# Patient Record
Sex: Male | Born: 1990 | Race: Black or African American | Hispanic: No | Marital: Single | Smoking: Former smoker
Health system: Southern US, Community
[De-identification: ages and names within clinical notes are randomized; demographics above are authoritative.]

## PROBLEM LIST (undated history)

## (undated) HISTORY — PX: LEG SURGERY: SHX1003

---
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
## 2009-02-24 ENCOUNTER — Emergency Department (HOSPITAL_COMMUNITY): Admission: EM | Admit: 2009-02-24 | Discharge: 2009-02-24 | Payer: Self-pay | Admitting: Emergency Medicine

## 2009-08-29 ENCOUNTER — Emergency Department (HOSPITAL_COMMUNITY): Admission: EM | Admit: 2009-08-29 | Discharge: 2009-08-29 | Payer: Self-pay | Admitting: Family Medicine

## 2009-09-21 ENCOUNTER — Emergency Department (HOSPITAL_COMMUNITY): Admission: EM | Admit: 2009-09-21 | Discharge: 2009-09-21 | Payer: Self-pay | Admitting: Family Medicine

## 2010-04-04 ENCOUNTER — Emergency Department (HOSPITAL_COMMUNITY): Admission: EM | Admit: 2010-04-04 | Discharge: 2010-04-04 | Payer: Self-pay | Admitting: Family Medicine

## 2010-04-04 ENCOUNTER — Emergency Department (HOSPITAL_COMMUNITY): Admission: EM | Admit: 2010-04-04 | Discharge: 2010-04-04 | Payer: Self-pay | Admitting: Emergency Medicine

## 2010-06-04 ENCOUNTER — Encounter
Admission: RE | Admit: 2010-06-04 | Discharge: 2010-06-21 | Payer: Self-pay | Source: Home / Self Care | Attending: Neurosurgery | Admitting: Neurosurgery

## 2010-06-21 ENCOUNTER — Encounter
Admission: RE | Admit: 2010-06-21 | Discharge: 2010-07-24 | Payer: Self-pay | Source: Home / Self Care | Attending: Neurosurgery | Admitting: Neurosurgery

## 2010-07-10 ENCOUNTER — Encounter: Admit: 2010-07-10 | Payer: Self-pay | Admitting: Neurosurgery

## 2010-07-13 ENCOUNTER — Encounter: Admit: 2010-07-13 | Payer: Self-pay | Admitting: Neurosurgery

## 2010-07-26 ENCOUNTER — Ambulatory Visit: Payer: Worker's Compensation | Admitting: Physical Therapy

## 2010-07-26 ENCOUNTER — Ambulatory Visit: Payer: Worker's Compensation | Admitting: Occupational Therapy

## 2010-07-26 ENCOUNTER — Ambulatory Visit: Payer: Self-pay | Admitting: Physical Therapy

## 2010-07-31 ENCOUNTER — Encounter: Payer: Self-pay | Admitting: Occupational Therapy

## 2010-07-31 ENCOUNTER — Ambulatory Visit: Payer: Self-pay | Admitting: Physical Therapy

## 2010-07-31 ENCOUNTER — Ambulatory Visit: Payer: Worker's Compensation | Attending: Neurosurgery | Admitting: Physical Therapy

## 2010-07-31 DIAGNOSIS — R4189 Other symptoms and signs involving cognitive functions and awareness: Secondary | ICD-10-CM | POA: Insufficient documentation

## 2010-07-31 DIAGNOSIS — R5381 Other malaise: Secondary | ICD-10-CM | POA: Insufficient documentation

## 2010-07-31 DIAGNOSIS — R41842 Visuospatial deficit: Secondary | ICD-10-CM | POA: Insufficient documentation

## 2010-07-31 DIAGNOSIS — R279 Unspecified lack of coordination: Secondary | ICD-10-CM | POA: Insufficient documentation

## 2010-07-31 DIAGNOSIS — M6281 Muscle weakness (generalized): Secondary | ICD-10-CM | POA: Insufficient documentation

## 2010-07-31 DIAGNOSIS — Z5189 Encounter for other specified aftercare: Secondary | ICD-10-CM | POA: Insufficient documentation

## 2010-07-31 DIAGNOSIS — R269 Unspecified abnormalities of gait and mobility: Secondary | ICD-10-CM | POA: Insufficient documentation

## 2010-08-02 ENCOUNTER — Ambulatory Visit: Payer: Worker's Compensation | Admitting: Occupational Therapy

## 2010-08-02 ENCOUNTER — Ambulatory Visit: Payer: Worker's Compensation | Admitting: Physical Therapy

## 2010-08-07 ENCOUNTER — Encounter: Payer: Self-pay | Admitting: Occupational Therapy

## 2010-08-07 ENCOUNTER — Ambulatory Visit: Payer: Self-pay | Admitting: Physical Therapy

## 2010-08-07 ENCOUNTER — Ambulatory Visit: Payer: Worker's Compensation | Admitting: Occupational Therapy

## 2010-08-07 ENCOUNTER — Ambulatory Visit: Payer: Worker's Compensation | Admitting: Physical Therapy

## 2010-08-09 ENCOUNTER — Ambulatory Visit: Payer: Worker's Compensation | Admitting: Physical Therapy

## 2010-08-09 ENCOUNTER — Ambulatory Visit: Payer: Worker's Compensation | Admitting: Occupational Therapy

## 2010-08-14 ENCOUNTER — Encounter: Payer: Self-pay | Admitting: Occupational Therapy

## 2010-08-14 ENCOUNTER — Ambulatory Visit: Payer: Self-pay | Admitting: Physical Therapy

## 2010-08-16 ENCOUNTER — Ambulatory Visit: Payer: Self-pay | Admitting: Physical Therapy

## 2010-08-16 ENCOUNTER — Encounter: Payer: Self-pay | Admitting: Occupational Therapy

## 2010-08-16 ENCOUNTER — Ambulatory Visit: Payer: Worker's Compensation | Admitting: Physical Therapy

## 2010-08-21 ENCOUNTER — Ambulatory Visit: Payer: Worker's Compensation | Admitting: Occupational Therapy

## 2010-08-21 ENCOUNTER — Ambulatory Visit: Payer: Worker's Compensation | Admitting: Physical Therapy

## 2010-08-23 ENCOUNTER — Ambulatory Visit: Payer: Worker's Compensation | Attending: Neurosurgery | Admitting: Physical Therapy

## 2010-08-23 ENCOUNTER — Ambulatory Visit: Payer: Worker's Compensation | Admitting: Occupational Therapy

## 2010-08-23 DIAGNOSIS — R269 Unspecified abnormalities of gait and mobility: Secondary | ICD-10-CM | POA: Insufficient documentation

## 2010-08-23 DIAGNOSIS — Z5189 Encounter for other specified aftercare: Secondary | ICD-10-CM | POA: Insufficient documentation

## 2010-08-23 DIAGNOSIS — R4189 Other symptoms and signs involving cognitive functions and awareness: Secondary | ICD-10-CM | POA: Insufficient documentation

## 2010-08-23 DIAGNOSIS — M6281 Muscle weakness (generalized): Secondary | ICD-10-CM | POA: Insufficient documentation

## 2010-08-23 DIAGNOSIS — R5381 Other malaise: Secondary | ICD-10-CM | POA: Insufficient documentation

## 2010-08-23 DIAGNOSIS — R279 Unspecified lack of coordination: Secondary | ICD-10-CM | POA: Insufficient documentation

## 2010-08-23 DIAGNOSIS — R41842 Visuospatial deficit: Secondary | ICD-10-CM | POA: Insufficient documentation

## 2010-08-28 ENCOUNTER — Ambulatory Visit: Payer: Worker's Compensation | Admitting: Physical Therapy

## 2010-08-28 ENCOUNTER — Ambulatory Visit: Payer: Worker's Compensation | Admitting: Occupational Therapy

## 2010-08-29 ENCOUNTER — Encounter: Payer: Self-pay | Admitting: Occupational Therapy

## 2010-08-29 ENCOUNTER — Ambulatory Visit: Payer: Self-pay | Admitting: Physical Therapy

## 2010-09-04 ENCOUNTER — Ambulatory Visit: Payer: Worker's Compensation | Admitting: Physical Therapy

## 2010-09-04 ENCOUNTER — Ambulatory Visit: Payer: Worker's Compensation | Admitting: Occupational Therapy

## 2010-09-06 ENCOUNTER — Ambulatory Visit: Payer: Worker's Compensation | Admitting: Physical Therapy

## 2010-09-06 ENCOUNTER — Ambulatory Visit: Payer: Worker's Compensation | Admitting: Occupational Therapy

## 2010-09-11 ENCOUNTER — Ambulatory Visit: Payer: Worker's Compensation | Admitting: Physical Therapy

## 2010-09-11 ENCOUNTER — Ambulatory Visit: Payer: Worker's Compensation | Admitting: Occupational Therapy

## 2010-09-13 ENCOUNTER — Ambulatory Visit: Payer: Self-pay | Admitting: Physical Therapy

## 2010-09-13 ENCOUNTER — Encounter: Payer: Self-pay | Admitting: Occupational Therapy

## 2010-09-18 ENCOUNTER — Ambulatory Visit: Payer: Worker's Compensation | Admitting: Occupational Therapy

## 2010-09-18 ENCOUNTER — Ambulatory Visit: Payer: Worker's Compensation | Admitting: Physical Therapy

## 2010-09-20 ENCOUNTER — Ambulatory Visit: Payer: Worker's Compensation | Admitting: Occupational Therapy

## 2010-09-20 ENCOUNTER — Ambulatory Visit: Payer: Worker's Compensation | Admitting: Physical Therapy

## 2010-11-23 HISTORY — PX: APPENDECTOMY: SHX54

## 2010-12-02 ENCOUNTER — Other Ambulatory Visit (INDEPENDENT_AMBULATORY_CARE_PROVIDER_SITE_OTHER): Payer: Self-pay | Admitting: General Surgery

## 2010-12-02 ENCOUNTER — Emergency Department (HOSPITAL_COMMUNITY): Payer: Self-pay

## 2010-12-02 ENCOUNTER — Ambulatory Visit (HOSPITAL_COMMUNITY)
Admission: EM | Admit: 2010-12-02 | Discharge: 2010-12-03 | Disposition: A | Discharge status: Home / Self Care | Payer: Self-pay | Attending: Emergency Medicine | Admitting: Emergency Medicine

## 2010-12-02 DIAGNOSIS — E860 Dehydration: Secondary | ICD-10-CM | POA: Insufficient documentation

## 2010-12-02 DIAGNOSIS — K3580 Unspecified acute appendicitis: Secondary | ICD-10-CM | POA: Insufficient documentation

## 2010-12-02 DIAGNOSIS — Z01812 Encounter for preprocedural laboratory examination: Secondary | ICD-10-CM | POA: Insufficient documentation

## 2010-12-02 LAB — DIFFERENTIAL
Basophils Absolute: 0 10*3/uL (ref 0.0–0.1)
Basophils Relative: 0 % (ref 0–1)
Eosinophils Relative: 0 % (ref 0–5)
Lymphocytes Relative: 18 % (ref 12–46)

## 2010-12-02 LAB — CBC
HCT: 37.4 % — ABNORMAL LOW (ref 39.0–52.0)
RBC: 4.97 MIL/uL (ref 4.22–5.81)
RDW: 13.7 % (ref 11.5–15.5)
WBC: 7.9 10*3/uL (ref 4.0–10.5)

## 2010-12-02 LAB — COMPREHENSIVE METABOLIC PANEL
ALT: 11 U/L (ref 0–53)
AST: 18 U/L (ref 0–37)
Alkaline Phosphatase: 82 U/L (ref 39–117)
Calcium: 9.2 mg/dL (ref 8.4–10.5)
Chloride: 101 mEq/L (ref 96–112)
GFR calc Af Amer: >60 mL/min (ref >60)

## 2010-12-02 LAB — LIPASE, BLOOD: Lipase: 11 U/L (ref 11–59)

## 2010-12-06 NOTE — H&P (Signed)
NAMEGEMAYEL, MASCIO NO.:  [id number]  MEDICAL RECORD NO.:  [id number]  LOCATION:  5126                         FACILITY:  [facility]  PHYSICIAN:  Almond Lint, MD       DATE OF BIRTH:  January 02, 1991  DATE OF ADMISSION:  12/02/2010 DATE OF DISCHARGE:                             HISTORY & PHYSICAL   CHIEF COMPLAINT:  Right lower quadrant abdominal pain.  HISTORY OF PRESENT ILLNESS:  Ryan Blankenship is a 20 year old black male with no other significant past medical history who awoke from sleep this morning at 0230 a.m. with significant abdominal pain.  By 3 a.m., the patient states that he was having profuse nausea and vomiting.  He states that he has not had a bowel movement today, but had a normal one yesterday.  Due to worsening pain that eventually migrated towards his right lower quadrant, he presented to the emergency department.  Upon evaluation, the patient was found to have a normal white blood cell count at 7900, however, he did have a CT scan which revealed acute appendicitis.  We have been asked to evaluate the patient for surgical admission.  REVIEW OF SYSTEMS:  Please see HPI, otherwise all other systems have been reviewed and are negative.  The patient does complain of some difficulty urinating as he feels very dehydrated.  FAMILY HISTORY:  Noncontributory.  PAST MEDICAL HISTORY:  Clubfoot.  PAST SURGICAL HISTORY:  Orthopedic surgery for his clubfoot.  SOCIAL HISTORY:  The patient denies any tobacco, but admits to rare occasional alcohol use.  He denies any illicit drug abuse.  ALLERGIES:  NKDA.  MEDICATIONS:  None.  PHYSICAL EXAMINATION:  GENERAL:  Ryan Blankenship is a pleasant 20 year old black male who is well developed and well nourished and in mild distress secondary to pain. VITAL SIGNS:  Temperature 98.2, pulse 84, respirations 20, and blood pressure 148/94. HEENT:  Head is normocephalic and atraumatic.  Sclerae are noninjected. Pupils are  equal, round, and reactive to light.  Ears and nose without any obvious masses or lesions.  No rhinorrhea.  Mouth is pink and dry. Throat shows no exudate. HEART:  Regular rate and rhythm.  Normal S1 and S2.  No murmurs, gallops, or rubs noted.  He has palpable carotid, radial, and pedal pulses bilaterally. LUNGS:  Clear to auscultation bilaterally with no wheezes, rhonchi, or rales noted.  Respiratory is nonlabored. ABDOMEN:  Soft, tender in the right lower quadrant with decreased hypoactive bowel sounds.  He is otherwise nondistended.  No masses, hernias, or organomegaly are noted. SKIN:  The patient does have multiple tattoos on his chest, abdomen, and extremities. MUSCULOSKELETAL:  All 4 extremities are symmetrical.  No cyanosis, clubbing, or edema. PSYCH:  The patient is alert and oriented x3 with an appropriate affect.  LABORATORY DATA:  White blood cell count 7900, hemoglobin 12.5, hematocrit 37.4, and platelet count is 208,000.  Sodium 139, potassium 3.5, glucose 96, BUN 10, and creatinine 0.91.  DIAGNOSTICS:  CT scan of the abdomen and pelvis reveals acute appendicitis.  IMPRESSION: 1. Acute appendicitis. 2. Dehydration.  PLAN:  At this time, we will get the patient admitted.  We will plan for surgical  intervention in the operating room with an appendectomy.  I have discussed the procedure with the patient as well as risks and complications such as bleeding, infection, staple line complications such as leak and postoperative abscess as well as injury to surrounding organs.  The patient understands and wishes to proceed. We will start the patient on IV Invanz as well as p.r.n. medications for pain and nausea.     Letha Cape, PA   ______________________________ Almond Lint, MD    KEO/MEDQ  D:  12/02/2010  T:  12/03/2010  Job:  [id number]  Electronically Signed by Barnetta Chapel PA on 12/06/2010 10:35:06 AM Electronically Signed by Almond Lint MD on  12/06/2010 01:59:07 PM

## 2010-12-06 NOTE — Op Note (Signed)
NAMECORMICK, Ryan Blankenship NO.:  [id number]  MEDICAL RECORD NO.:  [id number]  LOCATION:  5126                         FACILITY:  [facility]  PHYSICIAN:  Almond Lint, MD       DATE OF BIRTH:  Jul 13, 1990  DATE OF PROCEDURE:  12/02/2010 DATE OF DISCHARGE:                              OPERATIVE REPORT   PREOPERATIVE DIAGNOSIS:  Acute appendicitis.  POSTOPERATIVE DIAGNOSIS:  Acute appendicitis.  PROCEDURE:  Laparoscopic appendectomy.  SURGEON:  Almond Lint, MD  ANESTHESIA:  General and local.  FINDINGS:  Dilated inflamed appendix.  SPECIMEN:  Appendix to pathology.  ESTIMATED BLOOD LOSS:  Minimal.  COMPLICATIONS:  None known.  PROCEDURE:  [name] was identified in the holding area and taken to operating room where he was placed on the operating room table.  General anesthesia was induced.  His abdomen was clipped, prepped, and draped in sterile fashion.  Time-out was performed according to surgical safety check list.  When all was correct, we continued.  The infraumbilical skin was anesthetized with local anesthetic.  A curvilinear transverse incision was made between his tattoos, using a #11 blade.  The subcutaneous tissues were divided bluntly with the [facility].  The midline fascia was elevated with 2 Kocher clamps and incised with #11 blade. The Tresa Endo was used to confirm entrance into the peritoneal cavity.  A 0 Vicryl pursestring suture was placed around the fascial incision and used to hold a Hasson trocar in place to the abdominal wall. Pneumoperitoneum was achieved to a pressure of 15 mmHg.  The patient was placed into Trendelenburg position and rotated to the left.  A 5-mm trocar was placed into the epigastrium and one in the left lower quadrant.  The appendix was then grasped and elevated.  There were significant peritoneal attachments to it.  These were taken down with the harmonic.  This was skeletonized down to the base and the appendiceal artery  was divided with the harmonic.  Once this had been performed, the base of the appendix was transected with the Endo-GIA stapler.  This was then retrieved from the abdomen and an EndoCatch bag through the umbilical incision.  Pneumoperitoneum was reachieved and the cecum was examined.  The staple line was intact.  There was no evidence of bleeding.  The right lower quadrant was irrigated and the pelvis was examined.  The 4 quadrant inspection was performed demonstrating no evidence of gross pathology in the remainder of the abdomen.  The elevator and trocars were removed under direct visualization without evidence of bleeding from the abdominal wall.  Pneumoperitoneum was allowed to evacuate through the Hasson trocar.  The fascial incision was closed with a pursestring suture and there was no residual or palpable fascial defect.  The skin of all the incisions was closed with a 4-0 Monocryl in subcuticular fashion.  The wounds were then cleaned, dried, and dressed with Dermabond.  The patient was awakened from anesthesia and taken to PACU in stable condition.  Needle, sponge, and instrument counts were correct.     Almond Lint, MD     FB/MEDQ  D:  12/02/2010  T:  12/03/2010  Job:  [id number]  Electronically  Signed by Almond Lint MD on 12/06/2010 02:00:10 PM

## 2011-01-03 ENCOUNTER — Encounter (INDEPENDENT_AMBULATORY_CARE_PROVIDER_SITE_OTHER): Payer: Self-pay | Admitting: General Surgery

## 2011-01-03 ENCOUNTER — Ambulatory Visit (INDEPENDENT_AMBULATORY_CARE_PROVIDER_SITE_OTHER): Payer: Medicaid Other | Admitting: General Surgery

## 2011-01-03 DIAGNOSIS — K3580 Unspecified acute appendicitis: Secondary | ICD-10-CM

## 2011-01-03 NOTE — Progress Notes (Signed)
Subjective:     Patient ID: Ryan Blankenship, male   DOB: 1990/07/02, 20 y.o.   MRN: [id number]    There were no vitals taken for this visit.    HPI The patient is doing well status post laparoscopic appendectomy. He is having no fevers or chills. He denies any continuation of narcotics. He is having bowel movements and eating okay. His energy level is returning to normal. He does to have some burning at the umbilical incision when he bends and does strenuos activity. He has not been taking any over-the-counter analgesics. He denies difficulty urinating.  Review of Systems Otherwise negative.    Objective:   Physical Exam General: Alert and oriented x3 no acute distress. Abdomen: Soft, nontender, nondistended. Incisions are healing well. There is some firmness and tenderness at the umbilical incision. There is no sign of infection in any of the incisions.   Assessment:     Patient is doing well status post laparoscopic appendectomy.    Plan:     I advised the patient to try ibuprofen or Aleve to help with the discomfort at the umbilical incision. I also advised him to try to hold off on extremely strenuous activity for another week or 2. He had tried to wash his dog and that elicited most of the discomfort. I advised him to follow up with me on an as-needed basis.

## 2011-04-01 LAB — POCT RAPID STREP A: Streptococcus, Group A Screen (Direct): NEGATIVE

## 2012-02-27 ENCOUNTER — Emergency Department (INDEPENDENT_AMBULATORY_CARE_PROVIDER_SITE_OTHER)
Admission: EM | Admit: 2012-02-27 | Discharge: 2012-02-27 | Disposition: A | Discharge status: Home / Self Care | Payer: Self-pay | Source: Home / Self Care | Attending: Emergency Medicine | Admitting: Emergency Medicine

## 2012-02-27 ENCOUNTER — Encounter (HOSPITAL_COMMUNITY): Payer: Self-pay | Admitting: Emergency Medicine

## 2012-02-27 DIAGNOSIS — N419 Inflammatory disease of prostate, unspecified: Secondary | ICD-10-CM

## 2012-02-27 LAB — POCT URINALYSIS DIP (DEVICE)
Bilirubin Urine: NEGATIVE
Hgb urine dipstick: NEGATIVE
Leukocytes, UA: NEGATIVE
Specific Gravity, Urine: >=1.03 (ref 1.005–1.030)
pH: 6.5 (ref 5.0–8.0)

## 2012-02-27 LAB — CBC WITH DIFFERENTIAL/PLATELET
Basophils Absolute: 0 10*3/uL (ref 0.0–0.1)
HCT: 40.1 % (ref 39.0–52.0)
Lymphocytes Relative: 42 % (ref 12–46)
Lymphs Abs: 2.7 10*3/uL (ref 0.7–4.0)
MCH: 25 pg — ABNORMAL LOW (ref 26.0–34.0)
MCV: 76.1 fL — ABNORMAL LOW (ref 78.0–100.0)
Monocytes Absolute: 0.7 10*3/uL (ref 0.1–1.0)
RBC: 5.27 MIL/uL (ref 4.22–5.81)
RDW: 13.8 % (ref 11.5–15.5)

## 2012-02-27 LAB — OCCULT BLOOD, POC DEVICE: Fecal Occult Bld: NEGATIVE

## 2012-02-27 MED ORDER — CIPROFLOXACIN HCL 500 MG PO TABS: 500.0000 mg | ORAL_TABLET | Freq: Two times a day (BID) | ORAL | Status: DC

## 2012-02-27 MED ORDER — CIPROFLOXACIN HCL 500 MG PO TABS: 500.0000 mg | ORAL_TABLET | Freq: Two times a day (BID) | ORAL | Status: AC

## 2012-02-27 MED ORDER — TRAMADOL HCL 50 MG PO TABS: 100.0000 mg | ORAL_TABLET | Freq: Three times a day (TID) | ORAL | Status: AC | PRN

## 2012-02-27 NOTE — ED Provider Notes (Signed)
Chief Complaint  Patient presents with  . Dysuria    History of Present Illness:    The patient is a 21 year old male who has had a 2-3 month history of lower abdominal pain located in the suprapubic area radiating through the back and in the testicles. Feels like a cramping pain. It's worse when his bladder is full and relieved by emptying his bladder. He feels chilled and has lost about 7 pounds. His appetite is poor. He notes testicular pain, lower abdominal pain, and lower back pain after he urinates or after he has a bowel movement. This can last for minutes to hours at a time. His testicles are actually tender but they're not swollen. He denies any testicular mass. Then this gets better after while. He denies any fever, chills, nausea, or vomiting. He's had no diarrhea or constipation. He has occasional bright red blood on the toilet paper after a bowel movement but not in the stool.  Review of Systems:  Other than noted above, the patient denies any of the following symptoms: Constitutional:  No fever, chills, fatigue. Lungs:  No cough or shortness of breath. Heart:  No chest pain, palpitations, syncope or edema.  No cardiac history. Abdomen:  No nausea, vomiting, hematememesis, melena, diarrhea, or hematochezia. GU:  No dysuria, frequency, urgency, or hematuria.    PMFSH:  Past medical history, family history, social history, meds, and allergies were reviewed along with nurse's notes.  No prior abdominal surgeries or history of GI problems.  No use of NSAIDs or aspirin.  No excessive  alcohol intake.  Physical Exam:   Vital signs:  BP 104/54  Pulse 82  Temp 98.8 F (37.1 C) (Oral)  Resp 14  SpO2 99% Gen:  Alert, oriented, in no distress. Lungs:  Breath sounds clear and equal bilaterally.  No wheezes, rales or rhonchi. Heart:  Regular rhythm.  No gallops or murmers.   Abdomen:  Abdomen is soft, flat, nondistended. He has moderate pain to palpation in the suprapubic area. There is no  guarding or rebound. He has mild pain to palpation in the right and left lower quadrants. No upper abdominal pain to palpation. No organomegaly or mass. Bowel sounds are normally active. Genital exam: There is tenderness to palpation in both inguinal areas and extreme tenderness to palpation in both testes. There is no mass, but it's very difficult to palpate adequately due to his extreme tenderness. There is no urethral discharge and no penile lesions. Rectal exam:  Digital rectal exam reveals normal sphincter tone and no masses. The prostate was very tender to palpation but has not enlarged or boggy. There no masses. Stool is heme negative. Skin:  Clear, warm and dry.  No rash.    Labs:   Results for orders placed during the hospital encounter of 02/27/12  POCT URINALYSIS DIP (DEVICE)      Component Value Range   Glucose, UA NEGATIVE  NEGATIVE mg/dL   Bilirubin Urine NEGATIVE  NEGATIVE   Ketones, ur TRACE (*) NEGATIVE mg/dL   Specific Gravity, Urine >=1.030  1.005 - 1.030   Hgb urine dipstick NEGATIVE  NEGATIVE   pH 6.5  5.0 - 8.0   Protein, ur 100 (*) NEGATIVE mg/dL   Urobilinogen, UA 0.2  0.0 - 1.0 mg/dL   Nitrite NEGATIVE  NEGATIVE   Leukocytes, UA NEGATIVE  NEGATIVE  CBC WITH DIFFERENTIAL      Component Value Range   WBC 6.5  4.0 - 10.5 K/uL   RBC 5.27  4.22 - 5.81 MIL/uL   Hemoglobin 13.2  13.0 - 17.0 g/dL   HCT 45.4  09.8 - 11.9 %   MCV 76.1 (*) 78.0 - 100.0 fL   MCH 25.0 (*) 26.0 - 34.0 pg   MCHC 32.9  30.0 - 36.0 g/dL   RDW 14.7  82.9 - 56.2 %   Platelets 243  150 - 400 K/uL   Neutrophils Relative 47  43 - 77 %   Neutro Abs 3.1  1.7 - 7.7 K/uL   Lymphocytes Relative 42  12 - 46 %   Lymphs Abs 2.7  0.7 - 4.0 K/uL   Monocytes Relative 11  3 - 12 %   Monocytes Absolute 0.7  0.1 - 1.0 K/uL   Eosinophils Relative 0  0 - 5 %   Eosinophils Absolute 0.0  0.0 - 0.7 K/uL   Basophils Relative 0  0 - 1 %   Basophils Absolute 0.0  0.0 - 0.1 K/uL  OCCULT BLOOD, POC DEVICE       Component Value Range   Fecal Occult Bld NEGATIVE      Other Labs Obtained at Urgent Care Center:  Urine was cultured also obtained HIV and GC and Chlamydia DNA probes.  Results are pending at this time and we will call about any positive results.  Assessment:  The encounter diagnosis was Prostatitis.  Differential diagnosis includes prostatitis, urinary tract infection, urethritis, epididymitis, Crohn's disease, or other intra-abdominal process.  Plan:   1.  The following meds were prescribed:   New Prescriptions   CIPROFLOXACIN (CIPRO) 500 MG TABLET    Take 1 tablet (500 mg total) by mouth every 12 (twelve) hours.   TRAMADOL (ULTRAM) 50 MG TABLET    Take 2 tablets (100 mg total) by mouth every 8 (eight) hours as needed for pain.   2.  The patient was instructed in symptomatic care and handouts were given. 3.  The patient was told to return if becoming worse in any way, if no better in 3 or 4 days, and given some red flag symptoms that would indicate earlier return.  Follow up:  The patient was told to follow up with Dr. Ezzie Dural in 2 weeks. He was told to return to the emergency department if he should get worse in any way, particularly with fever or vomiting.    Reuben Likes, MD 02/27/12 2139

## 2012-02-27 NOTE — ED Notes (Signed)
For 2 weeks pt having abdominal, lower back, testicular and penile pain. Pt states it feels like he has to pee/stool huge amounts all the time and it hurts even after peeing. States it hurts his testicles and back before and after he pees/stools making it difficult to walk. He does not report frequency as he only goes 2-4 times a day. Pt noted small amounts of blood when wiping after bm's a few times. Pt denies discharge, or any new sexual partners and states he always uses protection.

## 2012-02-28 LAB — URINE CULTURE: Colony Count: NO GROWTH

## 2012-02-28 LAB — HIV ANTIBODY (ROUTINE TESTING W REFLEX): HIV: NONREACTIVE

## 2012-02-28 LAB — GC/CHLAMYDIA PROBE AMP, GENITAL: GC Probe Amp, Genital: NEGATIVE

## 2012-03-06 ENCOUNTER — Telehealth (HOSPITAL_COMMUNITY): Payer: Self-pay | Admitting: *Deleted

## 2012-03-06 NOTE — ED Notes (Signed)
Pt. called on VM on 9/11 for his lab results. 9/13 Pt. verified x 2 and given results. ( GC/Chlamydia neg., HIV non-reactive, Urine culture: no growth). Pt. Instructed to get his HIV rechecked in 6 mos. Vassie Moselle 03/06/2012

## 2012-07-22 ENCOUNTER — Encounter (HOSPITAL_COMMUNITY): Payer: Self-pay | Admitting: Emergency Medicine

## 2012-07-22 ENCOUNTER — Emergency Department (HOSPITAL_COMMUNITY): Payer: Worker's Compensation

## 2012-07-22 ENCOUNTER — Emergency Department (HOSPITAL_COMMUNITY)
Admission: EM | Admit: 2012-07-22 | Discharge: 2012-07-22 | Disposition: A | Discharge status: Home / Self Care | Payer: Worker's Compensation | Attending: Emergency Medicine | Admitting: Emergency Medicine

## 2012-07-22 DIAGNOSIS — W230XXA Caught, crushed, jammed, or pinched between moving objects, initial encounter: Secondary | ICD-10-CM | POA: Insufficient documentation

## 2012-07-22 DIAGNOSIS — Y9289 Other specified places as the place of occurrence of the external cause: Secondary | ICD-10-CM | POA: Insufficient documentation

## 2012-07-22 DIAGNOSIS — Y990 Civilian activity done for income or pay: Secondary | ICD-10-CM | POA: Insufficient documentation

## 2012-07-22 DIAGNOSIS — Y939 Activity, unspecified: Secondary | ICD-10-CM | POA: Insufficient documentation

## 2012-07-22 DIAGNOSIS — S5010XA Contusion of unspecified forearm, initial encounter: Secondary | ICD-10-CM | POA: Insufficient documentation

## 2012-07-22 DIAGNOSIS — R209 Unspecified disturbances of skin sensation: Secondary | ICD-10-CM | POA: Insufficient documentation

## 2012-07-22 LAB — CBC WITH DIFFERENTIAL/PLATELET
Basophils Absolute: 0 10*3/uL (ref 0.0–0.1)
Basophils Relative: 0 % (ref 0–1)
HCT: 40.2 % (ref 39.0–52.0)
Lymphocytes Relative: 21 % (ref 12–46)
Monocytes Absolute: 0.6 10*3/uL (ref 0.1–1.0)
Neutro Abs: 4.8 10*3/uL (ref 1.7–7.7)
Platelets: 225 10*3/uL (ref 150–400)
RBC: 5.32 MIL/uL (ref 4.22–5.81)
RDW: 14.1 % (ref 11.5–15.5)
WBC: 6.8 10*3/uL (ref 4.0–10.5)

## 2012-07-22 LAB — BASIC METABOLIC PANEL
BUN: 14 mg/dL (ref 6–23)
CO2: 26 mEq/L (ref 19–32)
Chloride: 105 mEq/L (ref 96–112)
Glucose, Bld: 76 mg/dL (ref 70–99)
Potassium: 3.9 mEq/L (ref 3.5–5.1)

## 2012-07-22 MED ORDER — HYDROCODONE-ACETAMINOPHEN 5-325 MG PO TABS: 1.0000 | ORAL_TABLET | Freq: Four times a day (QID) | ORAL | Status: AC | PRN

## 2012-07-22 MED ORDER — NAPROXEN 500 MG PO TABS: 500.0000 mg | ORAL_TABLET | Freq: Two times a day (BID) | ORAL | Status: AC

## 2012-07-22 MED ORDER — MORPHINE SULFATE 4 MG/ML IJ SOLN
4.0000 mg | Freq: Once | INTRAMUSCULAR | Status: AC
  Administered 2012-07-22: 4 mg via INTRAVENOUS
  Filled 2012-07-22: qty 1

## 2012-07-22 NOTE — ED Provider Notes (Signed)
History    CSN: [id number] Arrival date & time 07/22/12  1628 First MD Initiated Contact with Patient 07/22/12 1804    Chief Complaint  Patient presents with  . Arm Injury    HPI Pt was at work today approx 3pm when his arm got caught in a laminator.  His arm was stuck at the level of his elbow. Patient was seen in urgent care and x-rays the reportedly were normal. Patient was told to come to emergency room for further evaluation.  Patient has having a lot of pain in his upper arm. The pain increases whenever he tries to flex or extend his forearm as well as flex and extend his hand. He feels like his fingers are tingling and numb.  History reviewed. No pertinent past medical history.  Past Surgical History  Procedure Date  . Appendectomy 11/2010  . Leg surgery     No family history on file.  History  Substance Use Topics  . Smoking status: Never Smoker   . Smokeless tobacco: Not on file  . Alcohol Use: No      Review of Systems  All other systems reviewed and are negative.    Allergies  Review of patient's allergies indicates no known allergies.  Home Medications   Current Outpatient Rx  Name  Route  Sig  Dispense  Refill  . IBUPROFEN 200 MG PO TABS   Oral   Take 600 mg by mouth every 6 (six) hours as needed. For pain           BP 116/63  Pulse 67  Temp 98.5 F (36.9 C) (Oral)  SpO2 100%  Physical Exam  Nursing note and vitals reviewed. Constitutional: He appears well-developed and well-nourished. No distress.  HENT:  Head: Normocephalic and atraumatic.  Right Ear: External ear normal.  Left Ear: External ear normal.  Eyes: Conjunctivae normal are normal. Right eye exhibits no discharge. Left eye exhibits no discharge. No scleral icterus.  Neck: Neck supple. No tracheal deviation present.  Cardiovascular: Normal rate.   Pulmonary/Chest: Effort normal. No stridor. No respiratory distress.  Musculoskeletal: He exhibits edema and tenderness.       Right  forearm: He exhibits tenderness, swelling and edema. He exhibits no bony tenderness, no deformity and no laceration.       Left forearm: Normal. He exhibits no deformity and no laceration.       Strong radial pulse, tenderness palpation right elbow and upper arm, sensation light touch is intact although subjectively the patient feels like his sensation is altered in his fingertips,,  the pain increases in his forearm whenever he tries to flex and extend his wrist and forearm  Neurological: He is alert. Cranial nerve deficit: no gross deficits.  Skin: Skin is warm and dry. No rash noted.  Psychiatric: He has a normal mood and affect.    ED Course  Procedures (including critical care time)  Labs Reviewed  CBC WITH DIFFERENTIAL - Abnormal; Notable for the following:    MCV 75.6 (*)     MCH 24.8 (*)     All other components within normal limits  CK - Abnormal; Notable for the following:    Total CK 474 (*)     All other components within normal limits  BASIC METABOLIC PANEL   Dg Elbow 2 Views Right  07/22/2012  *RADIOLOGY REPORT*  Clinical Data: Right elbow injury.  Pain.  RIGHT ELBOW - 2 VIEW  Comparison: None.  Findings: The right elbow is located.  The forearm views demonstrate no effusion.  Soft tissue swelling is present dorsal to the elbow.  IMPRESSION:  1.  Dorsal soft tissue swelling without an acute or focal osseous abnormality.   Original Report Authenticated By: Marin Roberts, M.D.    Dg Forearm Right  07/22/2012  *RADIOLOGY REPORT*  Clinical Data: Right arm pain.  Machine injury.  RIGHT FOREARM - 2 VIEW  Comparison: Elbow films of the same day.  Findings: Soft tissue swelling is present about the elbow.  There is no underlying osseous abnormality.  There is no joint effusion. The forearm and wrist are unremarkable.  IMPRESSION:  1.  Soft tissue swelling dorsal to the elbow without an underlying fracture or effusion. 2.  The remainder of the forearm and wrist are normal.    Original Report Authenticated By: Marin Roberts, M.D.      1. Forearm contusion       MDM  The patient's symptoms are concerning for the possibility of a compartment syndrome considering a crush injury he experienced.  I reviewed his records and there are no x-rays available from the urgent care evaluation.  Will get x-rays here in emergent apartment. I've spoken with Dr. Mina Marble, orthopedic specialist who will come and evaluate the patient in emergent apartment   8:07 PM Dr Mina Marble evaluated the patient in the emergency department.  Pt does not have a compartment syndrome.  He can be treated with rest, ice , elevation and pain medications.        Celene Kras, MD 07/22/12 2007

## 2012-07-22 NOTE — ED Notes (Signed)
Ortho paged. 

## 2012-07-22 NOTE — ED Notes (Signed)
Pt st's his arm was caught in a machine today at work. Injury occurred about 2 1/2 hrs ago.  Was sent to ED from  Urgent Care.  Pt c/o pain with numbness to right elbow down to fingers.  Swelling present.

## 2012-07-22 NOTE — Consult Note (Signed)
Reason for Consult:right forearm swelling Referring Physician: knapp  Ryan Blankenship is an 22 y.o. male.  HPI: s/p roller injury at work today  History reviewed. No pertinent past medical history.  Past Surgical History  Procedure Date  . Appendectomy 11/2010  . Leg surgery     No family history on file.  Social History:  reports that he has never smoked. He does not have any smokeless tobacco history on file. He reports that he does not drink alcohol or use illicit drugs.  Allergies: No Known Allergies  Medications: PRN:    Results for orders placed during the hospital encounter of 07/22/12 (from the past 48 hour(s))  CBC WITH DIFFERENTIAL     Status: Abnormal   Collection Time   07/22/12  6:30 PM      Component Value Range Comment   WBC 6.8  4.0 - 10.5 K/uL    RBC 5.32  4.22 - 5.81 MIL/uL    Hemoglobin 13.2  13.0 - 17.0 g/dL    HCT 16.1  09.6 - 04.5 %    MCV 75.6 (*) 78.0 - 100.0 fL    MCH 24.8 (*) 26.0 - 34.0 pg    MCHC 32.8  30.0 - 36.0 g/dL    RDW 40.9  81.1 - 91.4 %    Platelets 225  150 - 400 K/uL    Neutrophils Relative 71  43 - 77 %    Neutro Abs 4.8  1.7 - 7.7 K/uL    Lymphocytes Relative 21  12 - 46 %    Lymphs Abs 1.4  0.7 - 4.0 K/uL    Monocytes Relative 8  3 - 12 %    Monocytes Absolute 0.6  0.1 - 1.0 K/uL    Eosinophils Relative 0  0 - 5 %    Eosinophils Absolute 0.0  0.0 - 0.7 K/uL    Basophils Relative 0  0 - 1 %    Basophils Absolute 0.0  0.0 - 0.1 K/uL   BASIC METABOLIC PANEL     Status: Normal   Collection Time   07/22/12  6:30 PM      Component Value Range Comment   Sodium 141  135 - 145 mEq/L    Potassium 3.9  3.5 - 5.1 mEq/L    Chloride 105  96 - 112 mEq/L    CO2 26  19 - 32 mEq/L    Glucose, Bld 76  70 - 99 mg/dL    BUN 14  6 - 23 mg/dL    Creatinine, Ser 7.82  0.50 - 1.35 mg/dL    Calcium 9.7  8.4 - 95.6 mg/dL    GFR calc non Af Amer >90  >90 mL/min    GFR calc Af Amer >90  >90 mL/min   CK     Status: Abnormal   Collection Time   07/22/12  6:30 PM      Component Value Range Comment   Total CK 474 (*) 7 - 232 U/L     Dg Elbow 2 Views Right  07/22/2012  *RADIOLOGY REPORT*  Clinical Data: Right elbow injury.  Pain.  RIGHT ELBOW - 2 VIEW  Comparison: None.  Findings: The right elbow is located.  The forearm views demonstrate no effusion.  Soft tissue swelling is present dorsal to the elbow.  IMPRESSION:  1.  Dorsal soft tissue swelling without an acute or focal osseous abnormality.   Original Report Authenticated By: Marin Roberts, M.D.    Dg Forearm  Right  07/22/2012  *RADIOLOGY REPORT*  Clinical Data: Right arm pain.  Machine injury.  RIGHT FOREARM - 2 VIEW  Comparison: Elbow films of the same day.  Findings: Soft tissue swelling is present about the elbow.  There is no underlying osseous abnormality.  There is no joint effusion. The forearm and wrist are unremarkable.  IMPRESSION:  1.  Soft tissue swelling dorsal to the elbow without an underlying fracture or effusion. 2.  The remainder of the forearm and wrist are normal.   Original Report Authenticated By: Marin Roberts, M.D.     Review of Systems  All other systems reviewed and are negative.   Blood pressure 116/63, pulse 67, temperature 98.5 F (36.9 C), temperature source Oral, SpO2 100.00%. Physical Exam  Constitutional: He is oriented to person, place, and time. He appears well-developed and well-nourished.  HENT:  Head: Normocephalic and atraumatic.  Cardiovascular: Normal rate.   Respiratory: Effort normal.  Musculoskeletal:       Right forearm: He exhibits tenderness and swelling.       Arms: Neurological: He is alert and oriented to person, place, and time.  Skin: Skin is warm.  Psychiatric: He has a normal mood and affect. His behavior is normal. Judgment and thought content normal.    Assessment/Plan: No signs of c/s  Would ice elevate advil  followup as needed  Anayelli Lai A 07/22/2012, 7:45 PM

## 2012-07-22 NOTE — ED Notes (Signed)
Ortho returned page  

## 2012-07-22 NOTE — Progress Notes (Signed)
Orthopedic Tech Progress Note Patient Details:  Ryan Blankenship 1990/07/04 [id number]  Ortho Devices Type of Ortho Device: Shoulder immobilizer Ortho Device/Splint Location: (R) UE Ortho Device/Splint Interventions: Application   Jennye Moccasin 07/22/2012, 8:36 PM

## 2012-08-08 ENCOUNTER — Other Ambulatory Visit: Payer: Self-pay

## 2013-04-29 ENCOUNTER — Other Ambulatory Visit: Payer: Self-pay

## 2015-05-15 ENCOUNTER — Encounter (HOSPITAL_COMMUNITY): Payer: Self-pay

## 2015-05-15 ENCOUNTER — Emergency Department (HOSPITAL_COMMUNITY): Payer: Self-pay

## 2015-05-15 ENCOUNTER — Emergency Department (HOSPITAL_COMMUNITY)
Admission: EM | Admit: 2015-05-15 | Discharge: 2015-05-15 | Disposition: A | Discharge status: Home / Self Care | Payer: Self-pay | Attending: Emergency Medicine | Admitting: Emergency Medicine

## 2015-05-15 DIAGNOSIS — J069 Acute upper respiratory infection, unspecified: Secondary | ICD-10-CM | POA: Insufficient documentation

## 2015-05-15 DIAGNOSIS — H938X3 Other specified disorders of ear, bilateral: Secondary | ICD-10-CM | POA: Insufficient documentation

## 2015-05-15 DIAGNOSIS — R0602 Shortness of breath: Secondary | ICD-10-CM | POA: Insufficient documentation

## 2015-05-15 DIAGNOSIS — J029 Acute pharyngitis, unspecified: Secondary | ICD-10-CM

## 2015-05-15 LAB — RAPID STREP SCREEN (MED CTR MEBANE ONLY): STREPTOCOCCUS, GROUP A SCREEN (DIRECT): NEGATIVE

## 2015-05-15 MED ORDER — ACETAMINOPHEN 500 MG PO TABS
1000.0000 mg | ORAL_TABLET | Freq: Once | ORAL | Status: AC
  Administered 2015-05-15: 1000 mg via ORAL
  Filled 2015-05-15: qty 2

## 2015-05-15 MED ORDER — DEXAMETHASONE 4 MG PO TABS
10.0000 mg | ORAL_TABLET | Freq: Once | ORAL | Status: AC
  Administered 2015-05-15: 10 mg via ORAL
  Filled 2015-05-15: qty 3

## 2015-05-15 MED ORDER — IBUPROFEN 800 MG PO TABS
800.0000 mg | ORAL_TABLET | Freq: Once | ORAL | Status: AC
  Administered 2015-05-15: 800 mg via ORAL
  Filled 2015-05-15: qty 1

## 2015-05-15 NOTE — Discharge Instructions (Signed)
Upper Respiratory Infection, Adult Most upper respiratory infections (URIs) are a viral infection of the air passages leading to the lungs. A URI affects the nose, throat, and upper air passages. The most common type of URI is nasopharyngitis and is typically referred to as "the common cold." URIs run their course and usually go away on their own. Most of the time, a URI does not require medical attention, but sometimes a bacterial infection in the upper airways can follow a viral infection. This is called a secondary infection. Sinus and middle ear infections are common types of secondary upper respiratory infections. Bacterial pneumonia can also complicate a URI. A URI can worsen asthma and chronic obstructive pulmonary disease (COPD). Sometimes, these complications can require emergency medical care and may be life threatening.  CAUSES Almost all URIs are caused by viruses. A virus is a type of germ and can spread from one person to another.  RISKS FACTORS You may be at risk for a URI if:   You smoke.   You have chronic heart or lung disease.  You have a weakened defense (immune) system.   You are very young or very old.   You have nasal allergies or asthma.  You work in crowded or poorly ventilated areas.  You work in health care facilities or schools. SIGNS AND SYMPTOMS  Symptoms typically develop 2-3 days after you come in contact with a cold virus. Most viral URIs last 7-10 days. However, viral URIs from the influenza virus (flu virus) can last 14-18 days and are typically more severe. Symptoms may include:   Runny or stuffy (congested) nose.   Sneezing.   Cough.   Sore throat.   Headache.   Fatigue.   Fever.   Loss of appetite.   Pain in your forehead, behind your eyes, and over your cheekbones (sinus pain).  Muscle aches.  DIAGNOSIS  Your health care provider may diagnose a URI by:  Physical exam.  Tests to check that your symptoms are not due to  another condition such as:  Strep throat.  Sinusitis.  Pneumonia.  Asthma. TREATMENT  A URI goes away on its own with time. It cannot be cured with medicines, but medicines may be prescribed or recommended to relieve symptoms. Medicines may help:  Reduce your fever.  Reduce your cough.  Relieve nasal congestion. HOME CARE INSTRUCTIONS   Take medicines only as directed by your health care provider.   Gargle warm saltwater or take cough drops to comfort your throat as directed by your health care provider.  Use a warm mist humidifier or inhale steam from a shower to increase air moisture. This may make it easier to breathe.  Drink enough fluid to keep your urine clear or pale yellow.   Eat soups and other clear broths and maintain good nutrition.   Rest as needed.   Return to work when your temperature has returned to normal or as your health care provider advises. You may need to stay home longer to avoid infecting others. You can also use a face mask and careful hand washing to prevent spread of the virus.  Increase the usage of your inhaler if you have asthma.   Do not use any tobacco products, including cigarettes, chewing tobacco, or electronic cigarettes. If you need help quitting, ask your health care provider. PREVENTION  The best way to protect yourself from getting a cold is to practice good hygiene.   Avoid oral or hand contact with people with cold   symptoms.   Wash your hands often if contact occurs.  There is no clear evidence that vitamin C, vitamin E, echinacea, or exercise reduces the chance of developing a cold. However, it is always recommended to get plenty of rest, exercise, and practice good nutrition.  SEEK MEDICAL CARE IF:   You are getting worse rather than better.   Your symptoms are not controlled by medicine.   You have chills.  You have worsening shortness of breath.  You have brown or red mucus.  You have yellow or brown nasal  discharge.  You have pain in your face, especially when you bend forward.  You have a fever.  You have swollen neck glands.  You have pain while swallowing.  You have white areas in the back of your throat. SEEK IMMEDIATE MEDICAL CARE IF:   You have severe or persistent:  Headache.  Ear pain.  Sinus pain.  Chest pain.  You have chronic lung disease and any of the following:  Wheezing.  Prolonged cough.  Coughing up blood.  A change in your usual mucus.  You have a stiff neck.  You have changes in your:  Vision.  Hearing.  Thinking.  Mood. MAKE SURE YOU:   Understand these instructions.  Will watch your condition.  Will get help right away if you are not doing well or get worse.   This information is not intended to replace advice given to you by your health care provider. Make sure you discuss any questions you have with your health care provider.   Document Released: 12/04/2000 Document Revised: 10/25/2014 Document Reviewed: 09/15/2013 Elsevier Interactive Patient Education 2016 [employer]  

## 2015-05-15 NOTE — ED Provider Notes (Signed)
CSN: [id number]     Arrival date & time 05/15/15  0715 History   First MD Initiated Contact with Patient 05/15/15 [phone]     Chief Complaint  Patient presents with  . Sore Throat  . Shortness of Breath     (Consider location/radiation/quality/duration/timing/severity/associated sxs/prior Treatment) Patient is a 24 y.o. male presenting with pharyngitis and shortness of breath. The history is provided by the patient.  Sore Throat This is a new problem. The current episode started more than 1 week ago. The problem occurs constantly. The problem has not changed since onset.Associated symptoms include shortness of breath. Pertinent negatives include no chest pain, no abdominal pain and no headaches. Nothing aggravates the symptoms. Nothing relieves the symptoms. He has tried nothing for the symptoms. The treatment provided no relief.  Shortness of Breath Associated symptoms: cough   Associated symptoms: no abdominal pain, no chest pain, no fever, no headaches, no rash and no vomiting    24 yo M with a chief complaint of sore throat and cough. This been going on for about a week. Patient feels like he's having trouble breathing through his nose. Was seen by his PCP about a week ago and given antibiotics which have not improved his symptoms. Patient denies fevers or chills. Patient denies chest pain. Denies abdominal pain or vomiting. Patient has some ear fullness bilaterally. Denies sick contacts.  History reviewed. No pertinent past medical history. Past Surgical History  Procedure Laterality Date  . Appendectomy  11/2010  . Leg surgery     No family history on file. Social History  Substance Use Topics  . Smoking status: Never Smoker   . Smokeless tobacco: None  . Alcohol Use: Yes     Comment: occassionally    Review of Systems  Constitutional: Negative for fever and chills.  HENT: Positive for congestion. Negative for facial swelling.   Eyes: Negative for discharge and visual  disturbance.  Respiratory: Positive for cough and shortness of breath.   Cardiovascular: Negative for chest pain and palpitations.  Gastrointestinal: Negative for vomiting, abdominal pain and diarrhea.  Musculoskeletal: Negative for myalgias and arthralgias.  Skin: Negative for color change and rash.  Neurological: Negative for tremors, syncope and headaches.  Psychiatric/Behavioral: Negative for confusion and dysphoric mood.      Allergies  Review of patient's allergies indicates no known allergies.  Home Medications   Prior to Admission medications   Medication Sig Start Date End Date Taking? Authorizing Provider  azithromycin (ZITHROMAX) 250 MG tablet Take 250 mg by mouth daily.   Yes Historical Provider, MD  HYDROcodone-acetaminophen (NORCO) 5-325 MG per tablet Take 1-2 tablets by mouth every 6 (six) hours as needed for pain. Patient not taking: Reported on 05/15/2015 07/22/12   Linwood Dibbles, MD  ibuprofen (ADVIL,MOTRIN) 200 MG tablet Take 600 mg by mouth every 6 (six) hours as needed. For pain    Historical Provider, MD  naproxen (NAPROSYN) 500 MG tablet Take 1 tablet (500 mg total) by mouth 2 (two) times daily. Patient not taking: Reported on 05/15/2015 07/22/12   Linwood Dibbles, MD   BP 132/84 mmHg  Pulse 69  Temp(Src) 98.3 F (36.8 C) (Oral)  Resp 16  Ht  (1.753 m)  Wt 222 lb (100.699 kg)  BMI 32.77 kg/m2  SpO2 98% Physical Exam  Constitutional: He is oriented to person, place, and time. He appears well-developed and well-nourished.  HENT:  Head: Normocephalic and atraumatic.  Purulent drainage to the left tonsil. No noted tonsillar swelling.  Handling secretions. Left anterior cervical lymphadenopathy. Swollen turbinates.   Eyes: EOM are normal. Pupils are equal, round, and reactive to light.  Neck: Normal range of motion. Neck supple. No JVD present.  Cardiovascular: Normal rate and regular rhythm.  Exam reveals no gallop and no friction rub.   No murmur  heard. Pulmonary/Chest: No respiratory distress. He has no wheezes.  Abdominal: He exhibits no distension. There is no rebound and no guarding.  Musculoskeletal: Normal range of motion.  Neurological: He is alert and oriented to person, place, and time.  Skin: No rash noted. No pallor.  Psychiatric: He has a normal mood and affect. His behavior is normal.  Nursing note and vitals reviewed.   ED Course  Procedures (including critical care time) Labs Review Labs Reviewed  RAPID STREP SCREEN (NOT AT [facility])  CULTURE, GROUP A STREP    Imaging Review Dg Chest 2 View  05/15/2015  CLINICAL DATA:  24 year old male with shortness breath and sore throat for 1 week. EXAM: CHEST  2 VIEW COMPARISON:  No priors. FINDINGS: Lung volumes are normal. No consolidative airspace disease. No pleural effusions. No pneumothorax. No pulmonary nodule or mass noted. Pulmonary vasculature and the cardiomediastinal silhouette are within normal limits. IMPRESSION: No radiographic evidence of acute cardiopulmonary disease. Electronically Signed   By: Trudie Reedaniel  Entrikin M.D.   On: 05/15/2015 07:58   I have personally reviewed and evaluated these images and lab results as part of my medical decision-making.   EKG Interpretation   Date/Time:  Monday May 15 2015 07:27:07 EST Ventricular Rate:  73 PR Interval:  138 QRS Duration: 69 QT Interval:  364 QTC Calculation: 401 R Axis:   57 Text Interpretation:  Sinus rhythm Probable left atrial enlargement  Baseline wander in lead(s) V4 No old tracing to compare Confirmed by Corrin Hingle  MD, DANIEL ([id number]) on 05/15/2015 10:38:02 AM      MDM   Final diagnoses:  Sore throat  URI (upper respiratory infection)    24 yo M with a chief complaints of cough congestion sore throat. Rapid strep negative. Chest x-ray unremarkable. EKG unremarkable. Likely URI. Suggested Sudafed Tylenol and Motrin push fluids.  I have discussed the diagnosis/risks/treatment options with the  patient and family and believe the pt to be eligible for discharge home to follow-up with PCP. We also discussed returning to the ED immediately if new or worsening sx occur. We discussed the sx which are most concerning (e.g., sudden worsening pain, fever, inability to tolerate by mouth) that necessitate immediate return. Medications administered to the patient during their visit and any new prescriptions provided to the patient are listed below.  Medications given during this visit Medications  acetaminophen (TYLENOL) tablet 1,000 mg (1,000 mg Oral Given 05/15/15 0743)  ibuprofen (ADVIL,MOTRIN) tablet 800 mg (800 mg Oral Given 05/15/15 0743)  dexamethasone (DECADRON) tablet 10 mg (10 mg Oral Given 05/15/15 0915)    Discharge Medication List as of 05/15/2015  9:08 AM      The patient appears reasonably screen and/or stabilized for discharge and I doubt any other medical condition or other [facility] requiring further screening, evaluation, or treatment in the ED at this time prior to discharge.     Melene Planan Rhianne Soman, DO 05/15/15 [phone]

## 2015-05-15 NOTE — ED Notes (Signed)
Pt. Presents with complaint of SOB and sore throat x 1 week. States seen by PCP and given abx, abx course completed with no improvement. Denies hx of asthma.

## 2015-05-15 NOTE — ED Notes (Signed)
Patient transported to X-ray 

## 2015-05-17 LAB — CULTURE, GROUP A STREP

## 2016-05-10 DIAGNOSIS — Y999 Unspecified external cause status: Secondary | ICD-10-CM | POA: Insufficient documentation

## 2016-05-10 DIAGNOSIS — M545 Low back pain: Secondary | ICD-10-CM | POA: Insufficient documentation

## 2016-05-10 DIAGNOSIS — Y92481 Parking lot as the place of occurrence of the external cause: Secondary | ICD-10-CM | POA: Insufficient documentation

## 2016-05-10 DIAGNOSIS — S80212A Abrasion, left knee, initial encounter: Secondary | ICD-10-CM | POA: Insufficient documentation

## 2016-05-10 DIAGNOSIS — Y939 Activity, unspecified: Secondary | ICD-10-CM | POA: Insufficient documentation

## 2016-05-11 ENCOUNTER — Emergency Department (HOSPITAL_COMMUNITY): Payer: Self-pay

## 2016-05-11 ENCOUNTER — Emergency Department (HOSPITAL_COMMUNITY)
Admission: EM | Admit: 2016-05-11 | Discharge: 2016-05-11 | Disposition: A | Discharge status: Home / Self Care | Payer: Self-pay | Attending: Emergency Medicine | Admitting: Emergency Medicine

## 2016-05-11 ENCOUNTER — Encounter (HOSPITAL_COMMUNITY): Payer: Self-pay | Admitting: Emergency Medicine

## 2016-05-11 DIAGNOSIS — M545 Low back pain, unspecified: Secondary | ICD-10-CM

## 2016-05-11 DIAGNOSIS — S80212A Abrasion, left knee, initial encounter: Secondary | ICD-10-CM

## 2016-05-11 MED ORDER — IBUPROFEN 800 MG PO TABS: 800.0000 mg | ORAL_TABLET | Freq: Three times a day (TID) | ORAL | 0 refills | Status: AC

## 2016-05-11 MED ORDER — IBUPROFEN 800 MG PO TABS
800.0000 mg | ORAL_TABLET | Freq: Once | ORAL | Status: AC
  Administered 2016-05-11: 800 mg via ORAL
  Filled 2016-05-11: qty 1

## 2016-05-11 MED ORDER — LIDOCAINE-PRILOCAINE 2.5-2.5 % EX CREA
TOPICAL_CREAM | Freq: Once | CUTANEOUS | Status: AC
  Administered 2016-05-11: 03:00:00 via TOPICAL
  Filled 2016-05-11: qty 5

## 2016-05-11 MED ORDER — CYCLOBENZAPRINE HCL 10 MG PO TABS: 5.0000 mg | ORAL_TABLET | Freq: Two times a day (BID) | ORAL | 0 refills | Status: AC | PRN

## 2016-05-11 MED ORDER — CYCLOBENZAPRINE HCL 10 MG PO TABS
10.0000 mg | ORAL_TABLET | Freq: Once | ORAL | Status: AC
  Administered 2016-05-11: 10 mg via ORAL
  Filled 2016-05-11: qty 1
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
## 2016-05-11 NOTE — ED Notes (Signed)
Ortho at bedside at this time providing patient with crutches/teaching.

## 2016-05-11 NOTE — Progress Notes (Signed)
Orthopedic Tech Progress Note Patient Details:  Ryan Blankenship 03/28/1991 [id number]  Ortho Devices Type of Ortho Device: Crutches Ortho Device/Splint Interventions: Ordered, Application   Trinna PostMartinez, [name] 05/11/2016, 3:32 AM

## 2016-07-01 ENCOUNTER — Telehealth: Payer: Self-pay

## 2016-07-01 ENCOUNTER — Institutional Professional Consult (permissible substitution): Payer: Managed Care, Other (non HMO) | Admitting: Neurology

## 2016-07-01 NOTE — Telephone Encounter (Signed)
I spoke to Ryan Blankenship and r/s to tomorrow. Provider is out sick.

## 2016-07-02 ENCOUNTER — Telehealth: Payer: Self-pay

## 2016-07-02 ENCOUNTER — Institutional Professional Consult (permissible substitution): Payer: Self-pay | Admitting: Neurology

## 2016-07-02 NOTE — Telephone Encounter (Signed)
LM for patient to call back and r/s, provider is out sick.

## 2016-07-04 ENCOUNTER — Telehealth: Payer: Self-pay

## 2016-07-04 ENCOUNTER — Encounter: Payer: Self-pay | Admitting: Neurology

## 2016-07-04 ENCOUNTER — Ambulatory Visit (INDEPENDENT_AMBULATORY_CARE_PROVIDER_SITE_OTHER): Payer: Managed Care, Other (non HMO) | Admitting: Neurology

## 2016-07-04 ENCOUNTER — Institutional Professional Consult (permissible substitution): Payer: Self-pay | Admitting: Neurology

## 2016-07-04 VITALS — BP 147/87 | HR 88 | Resp 20 | Ht 69.0 in | Wt 228.0 lb

## 2016-07-04 DIAGNOSIS — G4733 Obstructive sleep apnea (adult) (pediatric): Secondary | ICD-10-CM | POA: Insufficient documentation

## 2016-07-04 DIAGNOSIS — G478 Other sleep disorders: Secondary | ICD-10-CM | POA: Diagnosis not present

## 2016-07-04 DIAGNOSIS — G4726 Circadian rhythm sleep disorder, shift work type: Secondary | ICD-10-CM | POA: Diagnosis not present

## 2016-07-04 DIAGNOSIS — G4719 Other hypersomnia: Secondary | ICD-10-CM

## 2016-07-04 DIAGNOSIS — G47411 Narcolepsy with cataplexy: Secondary | ICD-10-CM | POA: Diagnosis not present

## 2016-07-04 DIAGNOSIS — R0683 Snoring: Secondary | ICD-10-CM

## 2016-07-04 MED ORDER — MODAFINIL 200 MG PO TABS: 200.0000 mg | ORAL_TABLET | Freq: Every day | ORAL | 0 refills | Status: DC
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
## 2016-07-04 NOTE — Telephone Encounter (Signed)
I spoke to patient and cancelled appt today with Dr. Frances FurbishAthar, she is leaving sick. I have rescheduled with Dr. Vickey Hugerohmeier this afternoon.

## 2016-07-04 NOTE — Patient Instructions (Addendum)
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 We may have to try different medications that may help you stay awake during the day. Not everything works with everybody the same way. Wake promoting agents include stimulants and non-stimulant type medications. The most common side effects with stimulants are weight loss, insomnia, nervousness, headaches, palpitations, rise in blood pressure, anxiety. Stimulants can be addictive and subject to abuse. Non-stimulant type wake promoting medications include Provigil and Nuvigil, most common side effects include headaches, nervousness, insomnia, hypertension. In addition there is a medication called Xyrem which has been proven to be very effective in patients with narcolepsy with or without cataplexy. Some patients with narcolepsy report episodes of weakness, such as jaw or facial weakness, legs giving out, feeling wobbly or like "Jell-o", etc. in situations of anxiety, stress, laughter, sudden sadness, surprise, etc., which is called cataplexy. You can also experience episodes of sleep paralysis during which you may feel unable to move upon awakening. Some people experience dreamlike sequences upon awakening or upon drifting off to sleep, called hypnopompic or hypnagogic hallucinations.    Modafinil tablets What is this medicine? MODAFINIL (moe DAF i nil) is used to treat excessive sleepiness caused by certain sleep disorders. This includes narcolepsy, sleep apnea, and shift work sleep disorder. This medicine may be used for other purposes; ask your health care provider or pharmacist if you have questions. COMMON BRAND NAME(S): Provigil What should I tell my health care provider before I take this medicine? They need to know if you have any of these conditions: -history of depression, mania, or other  mental disorder -kidney disease -liver disease -an unusual or allergic reaction to modafinil, other medicines, foods, dyes, or preservatives -pregnant or trying to get pregnant -breast-feeding How should I use this medicine? Take this medicine by mouth with a glass of water. Follow the directions on the prescription label. Take your doses at regular intervals. Do not take your medicine more often than directed. Do not stop taking except on your doctor's advice. A special MedGuide will be given to you by the pharmacist with each prescription and refill. Be sure to read this information carefully each time. Talk to your pediatrician regarding the use of this medicine in children. This medicine is not approved for use in children. Overdosage: If you think you have taken too much of this medicine contact a poison control center or emergency room at once. NOTE: This medicine is only for you. Do not share this medicine with others. What if I miss a dose? If you miss a dose, take it as soon as you can. If it is almost time for your next dose, take only that dose. Do not take double or extra doses. What may interact with this medicine? Do not take this medicine with any of the following medications: -amphetamine or dextroamphetamine -dexmethylphenidate or methylphenidate -medicines called MAO Inhibitors like Nardil, Parnate, Marplan, Eldepryl -pemoline -procarbazine This medicine may also interact with the following medications: -antifungal medicines like itraconazole or ketoconazole -barbiturates like phenobarbital -birth control pills or other hormone-containing birth control devices or implants -carbamazepine -cyclosporine -diazepam -medicines for depression, anxiety, or psychotic disturbances -phenytoin -propranolol -triazolam -warfarin This list may not describe all possible interactions. Give your health care provider a list of all the medicines, herbs, non-prescription drugs, or  dietary supplements you use. Also tell them   if you smoke, drink alcohol, or use illegal drugs. Some items may interact with your medicine. What should I watch for while using this medicine? Visit your doctor or health care professional for regular checks on your progress. The full effects of this medicine may not be seen right away. This medicine may affect your concentration, function, or may hide signs that you are tired. You may get dizzy. Do not drive, use machinery, or do anything that needs mental alertness until you know how this drug affects you. Alcohol can make you more dizzy and may interfere with your response to this medicine or your alertness. Avoid alcoholic drinks. Birth control pills may not work properly while you are taking this medicine. Talk to your doctor about using an extra method of birth control. It is unknown if the effects of this medicine will be increased by the use of caffeine. Caffeine is available in many foods, beverages, and medications. Ask your doctor if you should limit or change your intake of caffeine-containing products while on this medicine. What side effects may I notice from receiving this medicine? Side effects that you should report to your doctor or health care professional as soon as possible: -allergic reactions like skin rash, itching or hives, swelling of the face, lips, or tongue -anxiety -breathing problems -chest pain -fast, irregular heartbeat -hallucinations -increased blood pressure -redness, blistering, peeling or loosening of the skin, including inside the mouth -sore throat, fever, or chills -suicidal thoughts or other mood changes -tremors -vomiting Side effects that usually do not require medical attention (report to your doctor or health care professional if they continue or are bothersome): -headache -nausea, diarrhea, or stomach upset -nervousness -trouble sleeping This list may not describe all possible side effects. Call  your doctor for medical advice about side effects. You may report side effects to FDA at 1-800-FDA-1088. Where should I keep my medicine? Keep out of the reach of children. This medicine can be abused. Keep your medicine in a safe place to protect it from theft. Do not share this medicine with anyone. Selling or giving away this medicine is dangerous and against the law. This medicine may cause accidental overdose and death if taken by other adults, children, or pets. Mix any unused medicine with a substance like cat litter or coffee grounds. Then throw the medicine away in a sealed container like a sealed bag or a coffee can with a lid. Do not use the medicine after the expiration date. Store at room temperature between 20 and 25 degrees C (68 and 77 degrees F). NOTE: This sheet is a summary. It may not cover all possible information. If you have questions about this medicine, talk to your doctor, pharmacist, or health care provider.  2017 Elsevier/Gold Standard (2014-03-01 15:34:55)

## 2016-07-24 ENCOUNTER — Institutional Professional Consult (permissible substitution): Payer: Self-pay | Admitting: Neurology

## 2016-08-02 ENCOUNTER — Telehealth: Payer: Self-pay | Admitting: Neurology

## 2016-08-02 MED ORDER — MODAFINIL 200 MG PO TABS: 200.0000 mg | ORAL_TABLET | Freq: Every day | ORAL | 2 refills | Status: DC

## 2016-08-02 NOTE — Telephone Encounter (Signed)
Fax confirmation received [facility] (modafinil).  I LMVM prior to fax to confirm pharmacy, had to LM, [facility] per below.

## 2016-08-02 NOTE — Telephone Encounter (Signed)
Patient's mother calling to get Rx modafinil (PROVIGIL) 200 MG tablet called to [facility] [address]. Patient lost the original Rx.

## 2016-08-02 NOTE — Addendum Note (Signed)
Addended by: Melvyn NovasHMEIER, Victorio Creeden on: 08/02/2016 11:55 AM   Modules accepted: Orders

## 2016-08-05 ENCOUNTER — Ambulatory Visit (INDEPENDENT_AMBULATORY_CARE_PROVIDER_SITE_OTHER): Payer: Managed Care, Other (non HMO) | Admitting: Neurology

## 2016-08-05 DIAGNOSIS — G47411 Narcolepsy with cataplexy: Secondary | ICD-10-CM

## 2016-08-05 DIAGNOSIS — G4733 Obstructive sleep apnea (adult) (pediatric): Secondary | ICD-10-CM

## 2016-08-05 DIAGNOSIS — R0683 Snoring: Secondary | ICD-10-CM

## 2016-08-05 DIAGNOSIS — G4719 Other hypersomnia: Secondary | ICD-10-CM

## 2016-08-05 DIAGNOSIS — G478 Other sleep disorders: Secondary | ICD-10-CM

## 2016-08-09 ENCOUNTER — Telehealth: Payer: Self-pay | Admitting: Neurology

## 2016-08-09 NOTE — Telephone Encounter (Signed)
Patient called office in reference to see if his sleep study results were completed.  Patient is wanting to know if in the mean time if there is anything that he can be doing because he has been falling asleep at work.  He states he has been falling asleep and unable to concentrate while working.   Please call

## 2016-08-13 ENCOUNTER — Telehealth: Payer: Self-pay | Admitting: Neurology

## 2016-08-13 DIAGNOSIS — R0902 Hypoxemia: Secondary | ICD-10-CM

## 2016-08-13 DIAGNOSIS — G4733 Obstructive sleep apnea (adult) (pediatric): Secondary | ICD-10-CM

## 2016-08-13 NOTE — Telephone Encounter (Signed)
Patient called office returning nurse's call in reference to sleep study results.  Please call

## 2016-08-13 NOTE — Telephone Encounter (Signed)
I believe we can bring him back for in lab titration since they approved the psg. We will give it a try

## 2016-08-13 NOTE — Telephone Encounter (Signed)
IMPRESSION: severe OSA at an AHI of 35.9, associated with prolonged hypoxemia  RECOMMENDATIONS:  1. Advise full-night, attended, CPAP titration study to optimize therapy. If CPAP does not eliminate hypoxemia alongside the obvious apnea, consider titration to oxygen at night as well.

## 2016-08-13 NOTE — Telephone Encounter (Signed)
I spoke to patient and answered his questions about his sleep study. Voiced understanding and would like to proceed with titration study. He asked for a copy of his sleep study, I placed at the front desk for him to pick up

## 2016-08-13 NOTE — Telephone Encounter (Signed)
Ryan Blankenship . Please call. CD

## 2016-08-13 NOTE — Telephone Encounter (Signed)
-----   Message from Melvyn Novasarmen Dohmeier, MD sent at 08/13/2016  8:09 AM EST ----- Share with Dr Yetta BarreJones, please. IMPRESSION: severe OSA at an AHI of 35.9, associated with prolonged hypoxemia  RECOMMENDATIONS:  1. Advise full-night, attended, CPAP titration study to optimize therapy. If CPAP does not eliminate hypoxemia along apnea, consider titration to oxygen at night as well.

## 2016-08-13 NOTE — Telephone Encounter (Signed)
LM (per DPR) stating that patient does have severe sleep apnea. We will need to bring him back in for 2nd sleep study with a CPAP machine. I advised that the sleep lab will get approval from his insurance company and call him to set up next sleep study. Left call back number for further questions.   Copy sent to PCP.
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
## 2016-08-19 ENCOUNTER — Telehealth: Payer: Self-pay | Admitting: Neurology

## 2016-08-19 DIAGNOSIS — G4733 Obstructive sleep apnea (adult) (pediatric): Secondary | ICD-10-CM

## 2016-08-19 DIAGNOSIS — G4719 Other hypersomnia: Secondary | ICD-10-CM

## 2016-08-19 DIAGNOSIS — G4710 Hypersomnia, unspecified: Secondary | ICD-10-CM

## 2016-08-19 NOTE — Telephone Encounter (Signed)
[employer] denied CPAP suggest autopap

## 2016-08-19 NOTE — Telephone Encounter (Signed)
Patient called office in reference to having anxiety issues and not sure if it is related to mentally not having enough sleep.  Patient stated he doesn't know if he needs to see a physiatrist.  Please call

## 2016-08-19 NOTE — Telephone Encounter (Signed)
Patient has voiced being anxious about this whole process. Should he contact PCP about establishing care with psych?

## 2016-08-19 NOTE — Telephone Encounter (Signed)
[employer] denied in lab testing and care. Will need to order auto titration . CD

## 2016-08-19 NOTE — Telephone Encounter (Signed)
That is an excellent idea. I can make suggestions- [facility] [address] or [facility] have worked with sleep / insomnia, anxiety before. C D

## 2016-08-20 ENCOUNTER — Encounter: Payer: Self-pay | Admitting: Neurology

## 2016-08-20 NOTE — Telephone Encounter (Signed)
I spoke to patient and he is aware of recommendations. Patient is willing to start treatment. I will send orders to [facility]. Patient made f/u appt in May.

## 2016-08-20 NOTE — Telephone Encounter (Signed)
LM for patient to call back, Auto order has been placed.

## 2016-08-20 NOTE — Telephone Encounter (Signed)
I was advised that titration study is approved and Dawn will call patient to advise him. I will cancel Auto-PAP order.

## 2016-08-20 NOTE — Telephone Encounter (Signed)
LM for patient to call back.

## 2016-08-20 NOTE — Telephone Encounter (Signed)
I spoke to the patient about this. He took down the suggestions and will consult with his PCP about this.

## 2016-08-21 ENCOUNTER — Telehealth: Payer: Self-pay

## 2016-08-21 MED ORDER — MODAFINIL 200 MG PO TABS: 200.0000 mg | ORAL_TABLET | Freq: Every day | ORAL | 0 refills | Status: AC

## 2016-08-21 NOTE — Telephone Encounter (Signed)
Per patient he states that his Modafinil needs a PA. I advised him that we have received nothing on this. I called [facility]. They ran it and confirmed it does need a PA but also needs to be sent to mail order pharmacy.   I called patient back, he states that it is [facility]. I will print new prescription and fax to [facility], then they can send us a PA request. Patient voiced understanding.

## 2016-08-22 NOTE — Telephone Encounter (Signed)
[facility] had additional questions that were sent to us. I answered them and faxed back in today.  [id number]

## 2016-08-26 NOTE — Telephone Encounter (Signed)
PA approved through 11/22/2016. I spoke to patient and he is aware.

## 2016-08-29 ENCOUNTER — Ambulatory Visit (INDEPENDENT_AMBULATORY_CARE_PROVIDER_SITE_OTHER): Payer: Managed Care, Other (non HMO) | Admitting: Neurology

## 2016-08-29 DIAGNOSIS — G4733 Obstructive sleep apnea (adult) (pediatric): Secondary | ICD-10-CM | POA: Diagnosis not present

## 2016-08-29 DIAGNOSIS — G4719 Other hypersomnia: Secondary | ICD-10-CM

## 2016-08-29 DIAGNOSIS — R0902 Hypoxemia: Secondary | ICD-10-CM

## 2016-09-03 ENCOUNTER — Telehealth: Payer: Self-pay

## 2016-09-03 DIAGNOSIS — R0902 Hypoxemia: Secondary | ICD-10-CM | POA: Insufficient documentation

## 2016-09-03 NOTE — Procedures (Signed)
PATIENT'S NAME:  Ryan Blankenship, Ryan O. DOB:      07/27/1990      MR#:    [id number]     DATE OF RECORDING: 08/29/2016 REFERRING M.D.:  Knox RoyaltyEnrico Jones, MD Study Performed:   CPAP  Titration HISTORY:  This 26 year old male patient reported Excessive daytime sleepiness and Snoring.  [facility] an AHI of 35.9, REM AHI of 31.5/hr. and supine AHI of 38.1.  /hr. SPO2 nadir was 72%, and  Hypoxemia duration was 109 minutes. The patient returns for CPAP titration.  The patient endorsed the Epworth Sleepiness Scale at 23/24 points.   The patient's weight 227 pounds with a height of 69 (inches), resulting in a BMI of 34 kg/m2. The patient's neck circumference measured 18.5 inches.  CURRENT MEDICATIONS: Flexeril, Norco, Advil, Naprosyn.    PROCEDURE:  This is a multichannel digital polysomnogram utilizing the SomnoStar 11.2 system.  Electrodes and sensors were applied and monitored per AASM Specifications.   EEG, EOG, Chin and Limb EMG, were sampled at 200 Hz.  ECG, Snore and Nasal Pressure, Thermal Airflow, Respiratory Effort, CPAP Flow and Pressure, Oximetry was sampled at 50 Hz. Digital video and audio were recorded.       CPAP was initiated at 5 cmH20 with heated humidity per AASM split night standards and pressure was advanced to 15/15cmH20 because of hypopneas, apneas and desaturations.  At a PAP pressure of 13 cmH20, there was a reduction of the AHI to 0.0 with improvement of obstructive sleep apnea.    Lights Out was at 21:16 and Lights On at 04:59. Total recording time (TRT) was 463.5 minutes, with a total sleep time (TST) of 389.5 minutes. The patient's sleep latency was 69 minutes with 1.5 minutes of wake time after sleep onset. REM latency was 93 minutes.  The sleep efficiency was 84.0 %.    SLEEP ARCHITECTURE: WASO (Wake after sleep onset)  was 25 minutes.  There were 20.5 minutes in Stage N1, 180.5 minutes Stage N2, 90.5 minutes Stage N3 and 98 minutes in Stage REM.   The percentage of Stage N1 was 5.3%, Stage N2 was 46.3%, Stage N3 was 23.2% and Stage R (REM sleep) was 25.2%. The sleep architecture was notable for REM rebound under CPAP. There were 22 respiratory events: 1 central apnea and 7 mixed apneas with a total of 8 apneas and an apnea index (AI) of 1.2 /hour. There were 14 hypopneas with a hypopnea index of 2.2/hour. The patient also had 0 respiratory event related arousals (RERAs).     The total APNEA/HYPOPNEA INDEX  (AHI) was 3.4 /hour and the total RESPIRATORY DISTURBANCE INDEX was 3.4 .hour  0 events occurred in REM sleep and 22 events in NREM. The REM AHI was 0 /hour versus a non-REM AHI of 4.5 /hour.  The patient spent none of his  sleep time in the supine position and 390 minutes in non-supine.   OXYGEN SATURATION & C02:  The baseline 02 saturation was 92%, with the lowest being 80%. Time spent below 89% saturation equaled 14 minutes.  PERIODIC LIMB MOVEMENTS:    The patient had a total of 0 Periodic Limb Movements. The arousals were noted as: 32 were spontaneous, 0 were associated with PLMs, and 9 were associated with respiratory events.  Audio and video analysis did not show any abnormal or unusual movements, behaviors, phonations or vocalizations.  The patient took no bathroom breaks. Some snoring was noted before CPAP exceeded 10  cm water pressure. EKG was in keeping with normal sinus rhythm (NSR).   DIAGNOSIS 1. Obstructive Sleep Apnea with hypoxemia, both conditions alleviated under CPAP of 13, 14 and 15 cm water, SpO2 Nadir was lifted and hypoxemia recovered under CPAP.   PLANS/RECOMMENDATIONS: CPAP at 5-15 cm water with 2 cm EPR,   The patient was fitted with a Resmed AirTouch FFM, medium size. Weight loss is still recommended.  RV in 30-60days after compliant CPAP use. The follow up appointment will be scheduled in the Sleep Clinic at [facility].   Please call [phone] with any questions.      I certify that I  have reviewed the entire raw data recording prior to the issuance of this report in accordance with the Standards of Accreditation of the American Academy of Sleep Medicine (AASM)    Melvyn Novas, M.D.  09-03-2016 Diplomat, American Board of Psychiatry and Neurology  Diplomat, American Board of Sleep Medicine Medical Director, [facility] at [facility]

## 2016-09-03 NOTE — Telephone Encounter (Signed)
-----   Message from Melvyn Novasarmen Dohmeier, MD sent at 09/03/2016  4:27 PM EDT ----- DIAGNOSIS 1. Obstructive Sleep Apnea with hypoxemia, both conditions alleviated under CPAP of 13, 14 and 15 cm water, SpO2 Nadir was lifted and hypoxemia recovered under CPAP.   PLANS/RECOMMENDATIONS: CPAP at 5-15 cm water with 2 cm EPR,   The patient was fitted with a Resmed AirTouch FFM, medium size. Weight loss is still recommended.  RV in 30-60 days after compliant CPAP use. The follow up appointment will be scheduled in [facility] Clinic [facility].   Please call [phone] with any questions.

## 2016-09-03 NOTE — Telephone Encounter (Signed)
I called pt. I advised pt that his sleep study showed that his osa with hypoxemia was alleviated while using cpap and that Dr. Vickey Hugerohmeier recommends that he start it at home. Pt is agreeable to this. I advised him that I would send an order to a DME, [facility], and they will call the pt in a week to get this set up. I reviewed cpap compliance with the pt. A follow up appt was made for 11/12/2016 at 11:00am. Pt verbalized understanding of results. Pt had no questions at this time but was encouraged to call back if questions arise.

## 2016-09-03 NOTE — Addendum Note (Signed)
Addended by: Melvyn NovasHMEIER, Lindsie Simar on: 09/03/2016 04:27 PM   Modules accepted: Orders

## 2016-11-12 ENCOUNTER — Ambulatory Visit: Payer: Self-pay | Admitting: Neurology

## 2016-11-13 ENCOUNTER — Encounter: Payer: Self-pay | Admitting: Neurology

## 2016-12-04 ENCOUNTER — Ambulatory Visit: Payer: Self-pay | Admitting: Neurology
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
## 2021-01-09 ENCOUNTER — Ambulatory Visit (INDEPENDENT_AMBULATORY_CARE_PROVIDER_SITE_OTHER): Payer: Self-pay

## 2021-01-09 ENCOUNTER — Ambulatory Visit (INDEPENDENT_AMBULATORY_CARE_PROVIDER_SITE_OTHER): Payer: Medicaid Other | Admitting: Orthopedic Surgery

## 2021-01-09 DIAGNOSIS — M79672 Pain in left foot: Secondary | ICD-10-CM

## 2021-01-09 DIAGNOSIS — Q6689 Other  specified congenital deformities of feet: Secondary | ICD-10-CM

## 2021-01-10 NOTE — Progress Notes (Signed)
Office Visit Note   Patient: Ryan Blankenship           Date of Birth: [birth year]           MRN: [id number] Visit Date: 01/09/2021              Requested by: Knox Royalty, MD [address] [place],  [place] [address] PCP: Knox Royalty, MD  Chief Complaint  Patient presents with   Left Foot - Pain      HPI: Patient is a 29 year old gentleman who was seen for initial evaluation for persistent deformity left foot.  Patient states that as a child he has had multiple surgical interventions for the clubfoot deformity he states he had multiple rotational osteotomies of the tibia as well as multiple soft tissue procedures around the foot and ankle.  Patient states that he walks on the outside of his foot has a small calf and has difficulty with activities of daily living.  Assessment & Plan: Visit Diagnoses:  1. Pain in left foot   2. Clubfoot of left lower extremity     Plan: Will refer patient to [facility] for evaluation for possible soft tissue reconstruction or valgus osteotomy of the calcaneus left foot.  Follow-Up Instructions: No follow-ups on file.   Ortho Exam  Patient is alert, oriented, no adenopathy, well-dressed, normal affect, normal respiratory effort. Examination patient has a cavovarus foot with a varus calcaneus patient ambulates on the lateral side of his foot.  He lacks 20 degrees of dorsiflexion to neutral with range of motion of the ankle he has decreased subtalar motion.  His calf is small and atrophic.  There are multiple incisions around the foot ankle and anteriorly over the tibia.  There are no ulcers no calluses no skin breakdown.  Imaging: No results found. No images are attached to the encounter.  Labs: Lab Results  Component Value Date   REPTSTATUS 02/28/2012 FINAL 02/27/2012   CULT NO GROWTH 02/27/2012     Lab Results  Component Value Date   ALBUMIN 4.2 12/02/2010    No results found for: MG No results found for: VD25OH  No results found  for: PREALBUMIN CBC EXTENDED Latest Ref Rng & Units 07/22/2012 02/27/2012 12/02/2010  WBC 4.0 - 10.5 K/uL 6.8 6.5 7.9  RBC 4.22 - 5.81 MIL/uL 5.32 5.27 4.97  HGB 13.0 - 17.0 g/dL 92.4 26.8 12.5(L)  HCT 39.0 - 52.0 % 40.2 40.1 37.4(L)  PLT 150 - 400 K/uL 225 243 208  NEUTROABS 1.7 - 7.7 K/uL 4.8 3.1 5.9  LYMPHSABS 0.7 - 4.0 K/uL 1.4 2.7 1.4     There is no height or weight on file to calculate BMI.  Orders:  Orders Placed This Encounter  Procedures   XR Foot 2 Views Left   No orders of the defined types were placed in this encounter.    Procedures: No procedures performed  Clinical Data: No additional findings.  ROS:  All other systems negative, except as noted in the HPI. Review of Systems  Objective: Vital Signs: There were no vitals taken for this visit.  Specialty Comments:  No specialty comments available.  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  Family History  Problem Relation Age of Onset   Hypertension Mother     Past Surgical History:  Procedure Laterality Date   APPENDECTOMY  11/2010   LEG SURGERY     Social History   Occupational History   Not on file  Tobacco Use   Smoking status: Light Smoker   Smokeless tobacco: Never  Substance and Sexual Activity   Alcohol use: Yes    Alcohol/week: 1.0 standard drink    Types: 1 Standard drinks or equivalent per week    Comment: occassionally   Drug use: No   Sexual activity: Not on file

## 2021-01-11 ENCOUNTER — Other Ambulatory Visit: Payer: Self-pay

## 2021-01-11 DIAGNOSIS — Q6689 Other  specified congenital deformities of feet: Secondary | ICD-10-CM

## 2021-01-11 DIAGNOSIS — M79672 Pain in left foot: Secondary | ICD-10-CM

## 2021-03-21 ENCOUNTER — Other Ambulatory Visit: Payer: Self-pay

## 2021-03-21 ENCOUNTER — Ambulatory Visit (INDEPENDENT_AMBULATORY_CARE_PROVIDER_SITE_OTHER): Payer: Self-pay | Admitting: Plastic Surgery

## 2021-03-21 ENCOUNTER — Encounter: Payer: Self-pay | Admitting: Plastic Surgery

## 2021-03-21 VITALS — BP 149/101 | HR 66 | Ht 69.0 in | Wt 193.0 lb

## 2021-03-21 DIAGNOSIS — Q6689 Other  specified congenital deformities of feet: Secondary | ICD-10-CM

## 2021-03-21 NOTE — Progress Notes (Signed)
Referring Provider Knox Royalty, MD [address] [place],  [place] [address]   CC:  Chief Complaint  Patient presents with   Consult      Ryan Blankenship is an 30 y.o. male.  HPI: Patient presents to discuss improvement in the contour of his left calf versus his right.  He had a clubfoot as a child and has had multiple surgeries to correct the orientation and function of his left lower extremity.  He does have quite a bit less muscle mass in the left calf compared to the right which has been bothering him.  He wants to see if anything could be done.  No Known Allergies  Outpatient Encounter Medications as of 03/21/2021  Medication Sig   cyclobenzaprine (FLEXERIL) 10 MG tablet Take 0.5-1 tablets (5-10 mg total) by mouth 2 (two) times daily as needed.   HYDROcodone-acetaminophen (NORCO) 5-325 MG per tablet Take 1-2 tablets by mouth every 6 (six) hours as needed for pain.   ibuprofen (ADVIL,MOTRIN) 800 MG tablet Take 1 tablet (800 mg total) by mouth 3 (three) times daily.   modafinil (PROVIGIL) 200 MG tablet Take 1 tablet (200 mg total) by mouth daily.   naproxen (NAPROSYN) 500 MG tablet Take 1 tablet (500 mg total) by mouth 2 (two) times daily.   No facility-administered encounter medications on file as of 03/21/2021.     No past medical history on file.  Past Surgical History:  Procedure Laterality Date   APPENDECTOMY  11/2010   LEG SURGERY      Family History  Problem Relation Age of Onset   Hypertension Mother     Social History   Social History Narrative   Not on file     Review of Systems General: Denies fevers, chills, weight loss CV: Denies chest pain, shortness of breath, palpitations  Physical Exam Vitals with BMI 03/21/2021 07/04/2016 05/11/2016  Height 5\' 9"  5\' 9"  -  Weight 193 lbs 228 lbs -  BMI 28.49 33.7 -  Systolic 149 147  Diastolic 101 87 90  Pulse 66 88 72    General:  No acute distress,  Alert and oriented, Non-Toxic, Normal speech and  affect Examination shows quite a bit of atrophy or lack of development of the left lower extremity musculature compared to the right.  He has good sensation in his foot and a palpable pulse.  Multiple surgical scars from corrective surgeries which looks to be tendon lengthening and correction of the orientation of his foot.  He does have some adipose tissue in his abdomen and back.  Assessment/Plan Patient presents with severe calf asymmetry after having clubfoot congenitally.  He does seem to function reasonably well with that foot but has been bothered by the appearance of the left leg versus the right and the fairly significant asymmetry.  We discussed a number of options for improving the bulk but ultimately I brought up fat grafting which would add some soft tissue to that area but not give him the potential complications of an implant down the line.  He is interested in this approach and we will plan to submit to insurance.  I discussed the risks of the procedure that include bleeding, infection, damage to surrounding structures and need for additional procedures.  I discussed that this does have some limitations in terms of the amount of correction that can be obtained but we both agree that would be better to have living tissue there compared to prosthetic at this point.  All of his questions were answered.  Allena Napoleon 03/21/2021, 9:42 AM

## 2022-07-11 ENCOUNTER — Telehealth: Payer: Self-pay

## 2022-07-11 NOTE — Telephone Encounter (Signed)
Mychart msg sent. AS, CMA
